# Patient Record
Sex: Female | Born: 1990 | Hispanic: Yes | Marital: Married | Smoking: Never smoker
Health system: Southern US, Community
[De-identification: ages and names within clinical notes are randomized; demographics above are authoritative.]

## PROBLEM LIST (undated history)

## (undated) DIAGNOSIS — A048 Other specified bacterial intestinal infections: Secondary | ICD-10-CM

---

## 2019-08-19 ENCOUNTER — Other Ambulatory Visit: Payer: Self-pay | Admitting: Gastroenterology

## 2019-08-19 DIAGNOSIS — R1011 Right upper quadrant pain: Secondary | ICD-10-CM

## 2019-09-01 ENCOUNTER — Other Ambulatory Visit: Payer: Self-pay

## 2019-09-01 ENCOUNTER — Encounter (HOSPITAL_COMMUNITY)
Admission: RE | Admit: 2019-09-01 | Discharge: 2019-09-01 | Disposition: A | Discharge status: Home / Self Care | Payer: PRIVATE HEALTH INSURANCE | Source: Ambulatory Visit | Attending: Gastroenterology | Admitting: Gastroenterology

## 2019-09-01 DIAGNOSIS — R1011 Right upper quadrant pain: Secondary | ICD-10-CM | POA: Diagnosis present

## 2019-09-01 MED ORDER — TECHNETIUM TC 99M MEBROFENIN IV KIT
5.1800 | PACK | Freq: Once | INTRAVENOUS | Status: AC | PRN
  Administered 2019-09-01: 5.18 via INTRAVENOUS

## 2019-11-30 ENCOUNTER — Other Ambulatory Visit: Payer: Self-pay | Admitting: Gastroenterology

## 2019-11-30 ENCOUNTER — Other Ambulatory Visit (HOSPITAL_COMMUNITY): Payer: Self-pay | Admitting: Gastroenterology

## 2019-11-30 DIAGNOSIS — R110 Nausea: Secondary | ICD-10-CM

## 2019-12-11 ENCOUNTER — Encounter (HOSPITAL_COMMUNITY): Payer: 59

## 2019-12-24 ENCOUNTER — Ambulatory Visit (HOSPITAL_COMMUNITY): Admission: RE | Admit: 2019-12-24 | Payer: 59 | Source: Ambulatory Visit

## 2020-01-08 ENCOUNTER — Encounter (HOSPITAL_COMMUNITY): Admission: RE | Admit: 2020-01-08 | Payer: 59 | Source: Ambulatory Visit

## 2020-01-08 ENCOUNTER — Encounter (HOSPITAL_COMMUNITY): Payer: Self-pay

## 2020-01-21 ENCOUNTER — Encounter (HOSPITAL_COMMUNITY)
Admission: RE | Admit: 2020-01-21 | Discharge: 2020-01-21 | Disposition: A | Discharge status: Home / Self Care | Payer: 59 | Source: Ambulatory Visit | Attending: Gastroenterology | Admitting: Gastroenterology

## 2020-01-21 ENCOUNTER — Other Ambulatory Visit: Payer: Self-pay

## 2020-01-21 DIAGNOSIS — R110 Nausea: Secondary | ICD-10-CM | POA: Diagnosis present

## 2020-01-21 MED ORDER — TECHNETIUM TC 99M SULFUR COLLOID
2.0000 | Freq: Once | INTRAVENOUS | Status: AC | PRN
  Administered 2020-01-21: 2 via INTRAVENOUS

## 2022-03-09 ENCOUNTER — Ambulatory Visit (INDEPENDENT_AMBULATORY_CARE_PROVIDER_SITE_OTHER): Payer: Self-pay | Admitting: Podiatry

## 2022-03-09 DIAGNOSIS — S99921A Unspecified injury of right foot, initial encounter: Secondary | ICD-10-CM

## 2022-03-09 DIAGNOSIS — Z91199 Patient's noncompliance with other medical treatment and regimen due to unspecified reason: Secondary | ICD-10-CM

## 2022-03-09 NOTE — Progress Notes (Signed)
No show

## 2022-04-09 ENCOUNTER — Emergency Department (HOSPITAL_COMMUNITY): Payer: No Typology Code available for payment source

## 2022-04-09 ENCOUNTER — Encounter (HOSPITAL_COMMUNITY): Payer: Self-pay

## 2022-04-09 ENCOUNTER — Emergency Department (HOSPITAL_COMMUNITY)
Admission: EM | Admit: 2022-04-09 | Discharge: 2022-04-09 | Disposition: A | Discharge status: Home / Self Care | Payer: No Typology Code available for payment source | Attending: Emergency Medicine | Admitting: Emergency Medicine

## 2022-04-09 DIAGNOSIS — S0990XA Unspecified injury of head, initial encounter: Secondary | ICD-10-CM

## 2022-04-09 DIAGNOSIS — W228XXA Striking against or struck by other objects, initial encounter: Secondary | ICD-10-CM | POA: Insufficient documentation

## 2022-04-09 DIAGNOSIS — S060X0A Concussion without loss of consciousness, initial encounter: Secondary | ICD-10-CM | POA: Insufficient documentation

## 2022-04-09 MED ORDER — KETOROLAC TROMETHAMINE 30 MG/ML IJ SOLN
30.0000 mg | Freq: Once | INTRAMUSCULAR | Status: AC
  Administered 2022-04-09: 30 mg via INTRAVENOUS
  Filled 2022-04-09: qty 1

## 2022-04-09 MED ORDER — METOCLOPRAMIDE HCL 5 MG/ML IJ SOLN
10.0000 mg | Freq: Once | INTRAMUSCULAR | Status: AC
  Administered 2022-04-09: 10 mg via INTRAVENOUS
  Filled 2022-04-09: qty 2

## 2022-04-09 NOTE — Discharge Instructions (Addendum)
You were seen in the emergency department for evaluation of headache and unsteadiness after a head injury.  Please contact [facility] if persistent symptoms.  Rest.  Tylenol and ibuprofen for pain.  Return if any worsening or concerning symptoms.

## 2022-04-09 NOTE — ED Provider Notes (Signed)
[facility]EMERGENCY DEPT Provider Note   CSN: [id number] Arrival date & time: 04/09/22  1131     History  Chief Complaint  Patient presents with   Head Injury    Kristen Singleton is a 31 y.o. female.  She is here for evaluation of head injury.  She said she hit her head on the car door last week and then again today.  Since the initial injury she has been troubled by headache, photophobia, nausea and feeling unbalanced when she walks.  Sometimes has some blurry vision.  She does have a history of headaches and has been troubled by them for the last few years.  She is not on any blood thinners.  She has tried Excedrin and ibuprofen without improvement.  The history is provided by the patient.  Head Injury Location:  Generalized Mechanism of injury: direct blow   Pain details:    Quality:  Throbbing   Severity:  Severe   Duration:  4 days   Timing:  Constant   Progression:  Unchanged Chronicity:  New Relieved by:  Nothing Worsened by:  Light Ineffective treatments:  NSAIDs Associated symptoms: blurred vision, disorientation, headache and nausea   Associated symptoms: no difficulty breathing, no double vision, no focal weakness, no loss of consciousness, no neck pain and no vomiting       Home Medications Prior to Admission medications   Not on File      Allergies    Patient has no allergy information on record.    Review of Systems   Review of Systems  Constitutional:  Negative for fever.  HENT:  Negative for sore throat.   Eyes:  Positive for blurred vision and visual disturbance. Negative for double vision.  Respiratory:  Negative for shortness of breath.   Cardiovascular:  Negative for chest pain.  Gastrointestinal:  Positive for nausea. Negative for abdominal pain and vomiting.  Genitourinary:  Negative for dysuria.  Musculoskeletal:  Positive for gait problem. Negative for neck pain.  Skin:  Negative for rash.  Neurological:  Positive for  headaches. Negative for focal weakness, loss of consciousness, syncope and speech difficulty.   Physical Exam Updated Vital Signs BP 136/90 (BP Location: Left Arm)   Pulse 72   Temp 98.3 F (36.8 C) (Oral)   Resp 17   LMP 04/06/2022 (Approximate)   SpO2 100%  Physical Exam Vitals and nursing note reviewed.  Constitutional:      General: She is not in acute distress.    Appearance: Normal appearance. She is well-developed.  HENT:     Head: Normocephalic and atraumatic.  Eyes:     Conjunctiva/sclera: Conjunctivae normal.  Cardiovascular:     Rate and Rhythm: Normal rate and regular rhythm.     Heart sounds: No murmur heard. Pulmonary:     Effort: Pulmonary effort is normal. No respiratory distress.     Breath sounds: Normal breath sounds.  Abdominal:     Palpations: Abdomen is soft.     Tenderness: There is no abdominal tenderness.  Musculoskeletal:        General: No swelling.     Cervical back: Neck supple.  Skin:    General: Skin is warm and dry.     Capillary Refill: Capillary refill takes less than 2 seconds.  Neurological:     General: No focal deficit present.     Mental Status: She is alert and oriented to person, place, and time.     Cranial Nerves: No cranial  nerve deficit.     Sensory: No sensory deficit.     Motor: No weakness.    ED Results / Procedures / Treatments   Labs (all labs ordered are listed, but only abnormal results are displayed) Labs Reviewed - No data to display  EKG None  Radiology CT Head Wo Contrast  Result Date: 04/09/2022 CLINICAL DATA:  Head trauma, altered mental status. EXAM: CT HEAD WITHOUT CONTRAST TECHNIQUE: Contiguous axial images were obtained from the base of the skull through the vertex without intravenous contrast. RADIATION DOSE REDUCTION: This exam was performed according to the departmental dose-optimization program which includes automated exposure control, adjustment of the mA and/or kV according to patient size  and/or use of iterative reconstruction technique. COMPARISON:  None Available. FINDINGS: Brain: Slight hypodensity of the right cerebellar white matter tracts for instance on image 9/2 and 51/4 with linear band of hypodensity involving the midbrain on concurrent images for instance 9/2. No evidence of acute hemorrhage, hydrocephalus, extra-axial collection or mass lesion/mass effect. Vascular: No hyperdense vessel or unexpected calcification. Skull: Normal. Negative for fracture or focal lesion. Sinuses/Orbits: Visualized portions of the paranasal sinuses and ethmoid air cells are predominantly clear. Orbits are grossly unremarkable. Other: Mastoid air cells are predominantly clear. IMPRESSION: Evidence of intracranial hemorrhage. Slight hypodensity of the right cerebellar white matter tracts with linear band of hypodensity involving the midbrain on concurrent images for instance 9/2. Findings are favored to reflect artifact however in the setting of altered mental status would consider further evaluation with MRI brain. Electronically Signed   By: Maudry Mayhew M.D.   On: 04/09/2022 12:31   MR BRAIN WO CONTRAST  Result Date: 04/09/2022 CLINICAL DATA:  Ataxia, head trauma EXAM: MRI HEAD WITHOUT CONTRAST TECHNIQUE: Multiplanar, multiecho pulse sequences of the brain and surrounding structures were obtained without intravenous contrast. COMPARISON:  None Available. FINDINGS: Brain: There is no acute infarction or intracranial hemorrhage. There is no intracranial mass, mass effect, or edema. There is no hydrocephalus or extra-axial fluid collection. Ventricles and sulci are normal in size and configuration. Vascular: Major vessel flow voids at the skull base are preserved. Skull and upper cervical spine: Normal marrow signal is preserved. Sinuses/Orbits: Paranasal sinuses are aerated. Orbits are unremarkable. Other: Sella is unremarkable.  Mastoid air cells are clear. IMPRESSION: Normal MRI of the head.  Electronically Signed   By: Guadlupe Spanish M.D.   On: 04/09/2022 14:37    Procedures Procedures    Medications Ordered in ED Medications  metoCLOPramide (REGLAN) injection 10 mg (has no administration in time range)  ketorolac (TORADOL) 30 MG/ML injection 30 mg (has no administration in time range)    ED Course/ Medical Decision Making/ A&P Clinical Course as of 04/09/22 1900  Mon Apr 09, 2022  1443 MRI did not show any acute findings.  Reviewed with patient and husband.  We will give contact information for [facility].  Return instructions discussed [MB]    Clinical Course User Index [MB] Terrilee Files, MD                           Medical Decision Making Amount and/or Complexity of Data Reviewed Radiology: ordered.  Risk Prescription drug management.  This patient complains of head injury nausea unsteadiness; this involves an extensive number of treatment Options and is a complaint that carries with it a high risk of complications and morbidity. The differential includes concussion, skull fracture, intracranial bleed  I ordered  medication IV Toradol and Reglan and reviewed PMP when indicated. I ordered imaging studies which included CT head and MRI brain and I independently    visualized and interpreted imaging which showed no acute findings Additional history obtained from patient significant other Previous records obtained and reviewed in epic no recent admissions Cardiac monitoring reviewed, sinus rhythm Social determinants considered, no significant barriers Critical Interventions: None  After the interventions stated above, I reevaluated the patient and found patient symptoms to be improved Admission and further testing considered, no indications for admission or further work-up at this time.  Given contact information for concussion clinic.  Return instructions discussed.          Final Clinical Impression(s) / ED Diagnoses Final  diagnoses:  Injury of head, initial encounter  Concussion without loss of consciousness, initial encounter    Rx / DC Orders ED Discharge Orders     None         Terrilee FilesButler, Youa Deloney C, MD 04/09/22 1902

## 2022-04-09 NOTE — ED Triage Notes (Signed)
Pt reports closing her trunk and hitting the back of her head with the trunk on Thursday.   Reports since then has been disoriented, nauseas, and dizziness.   Reports she has a migraine and sharp pain where the trunk hit her occipital/top area of head.   A/ox4 Ambulatory in triage.

## 2022-09-30 ENCOUNTER — Emergency Department (HOSPITAL_COMMUNITY): Payer: No Typology Code available for payment source

## 2022-09-30 ENCOUNTER — Other Ambulatory Visit: Payer: Self-pay

## 2022-09-30 ENCOUNTER — Encounter (HOSPITAL_COMMUNITY): Payer: Self-pay | Admitting: Emergency Medicine

## 2022-09-30 ENCOUNTER — Emergency Department (HOSPITAL_COMMUNITY)
Admission: EM | Admit: 2022-09-30 | Discharge: 2022-10-01 | Discharge status: Against Medical Advice | Payer: No Typology Code available for payment source | Attending: Emergency Medicine | Admitting: Emergency Medicine

## 2022-09-30 ENCOUNTER — Ambulatory Visit (HOSPITAL_COMMUNITY): Admission: EM | Admit: 2022-09-30 | Discharge: 2022-09-30 | Payer: No Typology Code available for payment source

## 2022-09-30 DIAGNOSIS — R142 Eructation: Secondary | ICD-10-CM | POA: Diagnosis not present

## 2022-09-30 DIAGNOSIS — R1084 Generalized abdominal pain: Secondary | ICD-10-CM | POA: Insufficient documentation

## 2022-09-30 DIAGNOSIS — R1110 Vomiting, unspecified: Secondary | ICD-10-CM

## 2022-09-30 DIAGNOSIS — Z5321 Procedure and treatment not carried out due to patient leaving prior to being seen by health care provider: Secondary | ICD-10-CM | POA: Insufficient documentation

## 2022-09-30 LAB — URINALYSIS, ROUTINE W REFLEX MICROSCOPIC
Bilirubin Urine: NEGATIVE
Glucose, UA: NEGATIVE mg/dL
Ketones, ur: NEGATIVE mg/dL
Nitrite: NEGATIVE
Protein, ur: NEGATIVE mg/dL
Specific Gravity, Urine: 1.011 (ref 1.005–1.030)
pH: 5 (ref 5.0–8.0)

## 2022-09-30 LAB — COMPREHENSIVE METABOLIC PANEL
ALT: 13 U/L (ref 0–44)
AST: 17 U/L (ref 15–41)
Albumin: 4.1 g/dL (ref 3.5–5.0)
Alkaline Phosphatase: 67 U/L (ref 38–126)
Anion gap: 8 (ref 5–15)
BUN: 8 mg/dL (ref 6–20)
CO2: 24 mmol/L (ref 22–32)
Calcium: 9.3 mg/dL (ref 8.9–10.3)
Chloride: 106 mmol/L (ref 98–111)
Creatinine, Ser: 0.77 mg/dL (ref 0.44–1.00)
GFR, Estimated: >60 mL/min (ref >60)
Glucose, Bld: 84 mg/dL (ref 70–99)
Potassium: 4.1 mmol/L (ref 3.5–5.1)
Sodium: 138 mmol/L (ref 135–145)
Total Bilirubin: 0.6 mg/dL (ref 0.3–1.2)
Total Protein: 7.4 g/dL (ref 6.5–8.1)

## 2022-09-30 LAB — CBC WITH DIFFERENTIAL/PLATELET
Abs Immature Granulocytes: 0.03 10*3/uL (ref 0.00–0.07)
Basophils Absolute: 0 10*3/uL (ref 0.0–0.1)
Basophils Relative: 0 %
Eosinophils Absolute: 0.1 10*3/uL (ref 0.0–0.5)
Eosinophils Relative: 1 %
HCT: 38.5 % (ref 36.0–46.0)
Hemoglobin: 11.7 g/dL — ABNORMAL LOW (ref 12.0–15.0)
Immature Granulocytes: 0 %
Lymphocytes Relative: 39 %
Lymphs Abs: 3.5 10*3/uL (ref 0.7–4.0)
MCH: 20.7 pg — ABNORMAL LOW (ref 26.0–34.0)
MCHC: 30.4 g/dL (ref 30.0–36.0)
MCV: 68.3 fL — ABNORMAL LOW (ref 80.0–100.0)
Monocytes Absolute: 0.5 10*3/uL (ref 0.1–1.0)
Monocytes Relative: 6 %
Neutro Abs: 4.9 10*3/uL (ref 1.7–7.7)
Neutrophils Relative %: 54 %
Platelets: 270 10*3/uL (ref 150–400)
RBC: 5.64 MIL/uL — ABNORMAL HIGH (ref 3.87–5.11)
RDW: 14.6 % (ref 11.5–15.5)
WBC: 9.1 10*3/uL (ref 4.0–10.5)
nRBC: 0 % (ref 0.0–0.2)

## 2022-09-30 LAB — PREGNANCY, URINE: Preg Test, Ur: NEGATIVE

## 2022-09-30 LAB — LIPASE, BLOOD: Lipase: 31 U/L (ref 11–51)

## 2022-09-30 MED ORDER — METOCLOPRAMIDE HCL 5 MG/ML IJ SOLN
10.0000 mg | Freq: Once | INTRAMUSCULAR | Status: AC
  Administered 2022-09-30: 10 mg via INTRAMUSCULAR

## 2022-09-30 MED ORDER — ONDANSETRON HCL 4 MG/2ML IJ SOLN
INTRAMUSCULAR | Status: AC
  Filled 2022-09-30: qty 4

## 2022-09-30 MED ORDER — METOCLOPRAMIDE HCL 5 MG/ML IJ SOLN
INTRAMUSCULAR | Status: AC
  Filled 2022-09-30: qty 2

## 2022-09-30 MED ORDER — ONDANSETRON HCL 4 MG/2ML IJ SOLN
8.0000 mg | Freq: Once | INTRAMUSCULAR | Status: AC
  Administered 2022-09-30: 8 mg via INTRAMUSCULAR

## 2022-09-30 NOTE — ED Provider Notes (Signed)
[facility]    CSN: [id number] Arrival date & time: 09/30/22  1600      History   Chief Complaint Chief Complaint  Patient presents with   Emesis    HPI Kristen Singleton is a 31 y.o. female.   Vomiting persistentily for 3 days, unable to tolerate food and lqiuids, no pattern or trigger, increased gas, gernralzied throbbing, aching,lbm - 3 days ago,   Dizziness, weakness headache, electrolyte water,   Has had vomiting for the three years, gas-x,   History reviewed. No pertinent past medical history.  There are no problems to display for this patient.   History reviewed. No pertinent surgical history.  OB History   No obstetric history on file.      Home Medications    Prior to Admission medications   Medication Sig Start Date End Date Taking? Authorizing Provider  ondansetron (ZOFRAN-ODT) 4 MG disintegrating tablet Take by mouth. 09/05/22  Yes [provider]  omeprazole (PRILOSEC) 20 MG capsule Omeprazole    [provider]    Family History History reviewed. No pertinent family history.  Social History Social History   Tobacco Use   Smoking status: Never   Smokeless tobacco: Never  Vaping Use   Vaping Use: Never used  Substance Use Topics   Alcohol use: Not Currently   Drug use: Never     Allergies   Patient has no known allergies.   Review of Systems Review of Systems  HENT:  Positive for sore throat. Negative for congestion, dental problem, drooling, ear discharge, ear pain, facial swelling, hearing loss, mouth sores, nosebleeds, postnasal drip, rhinorrhea, sinus pressure, sinus pain, sneezing, tinnitus, trouble swallowing and voice change.   Gastrointestinal:  Positive for vomiting. Negative for abdominal distention, abdominal pain, anal bleeding, blood in stool, constipation, diarrhea, nausea and rectal pain.  Skin: Negative.      Physical Exam Triage Vital Signs ED Triage Vitals  Enc Vitals Group     BP  09/30/22 1620 105/74     Pulse Rate 09/30/22 1620 99     Resp 09/30/22 1620 20     Temp 09/30/22 1620 98.4 F (36.9 C)     Temp Source 09/30/22 1620 Oral     SpO2 09/30/22 1620 100 %     Weight --      Height --      Head Circumference --      Peak Flow --      Pain Score 09/30/22 1617 10     Pain Loc --      Pain Edu? --      Excl. in GC? --    No data found.  Updated Vital Signs BP 105/74 Comment: large cuff  Pulse 99   Temp 98.4 F (36.9 C) (Oral)   Resp 20   LMP 09/08/2022   SpO2 100%   Visual Acuity Right Eye Distance:   Left Eye Distance:   Bilateral Distance:    Right Eye Near:   Left Eye Near:    Bilateral Near:     Physical Exam   UC Treatments / Results  Labs (all labs ordered are listed, but only abnormal results are displayed) Labs Reviewed - No data to display  EKG   Radiology No results found.  Procedures Procedures (including critical care time)  Medications Ordered in UC Medications - No data to display  Initial Impression / Assessment and Plan / UC Course  I have reviewed the triage vital signs and  the nursing notes.  Pertinent labs & imaging results that were available during my care of the patient were reviewed by me and considered in my medical decision making (see chart for details).     *** Final Clinical Impressions(s) / UC Diagnoses   Final diagnoses:  None   Discharge Instructions   None    ED Prescriptions   None    PDMP not reviewed this encounter.

## 2022-09-30 NOTE — ED Provider Triage Note (Signed)
Emergency Medicine Provider Triage Evaluation Note  Danuta Huseman , a 31 y.o. female  was evaluated in triage.  Pt complains of vomiting. Unable to hold down food or fluids x 3 days.  Hx of h.pylori treated and resolved. C/o belching. Vomiting. No bm's.  Review of Systems  Positive: vomiting Negative: fever  Physical Exam  BP 107/70 (BP Location: Left Arm)   Pulse 86   Temp 98.4 F (36.9 C) (Oral)   Resp 19   LMP 09/08/2022   SpO2 99%  Gen:   Awake, no distress   Resp:  Normal effort  MSK:   Moves extremities without difficulty  Other:  Actively blching  Medical Decision Making  Medically screening exam initiated at 7:02 PM.  Appropriate orders placed.  Indie Boehne was informed that the remainder of the evaluation will be completed by another provider, this initial triage assessment does not replace that evaluation, and the importance of remaining in the ED until their evaluation is complete.     Arthor Captain, PA-C 09/30/22 1907

## 2022-09-30 NOTE — ED Notes (Signed)
Patient is being discharged from the Urgent Care and sent to the Emergency Department via personal vehicle. Per Salli Quarry NP, patient is in need of higher level of care due to emesis. Patient is aware and verbalizes understanding of plan of care.  Vitals:   09/30/22 1620  BP: 105/74  Pulse: 99  Resp: 20  Temp: 98.4 F (36.9 C)  SpO2: 100%

## 2022-09-30 NOTE — ED Notes (Signed)
Patient was discharged from urgent care.  Patient was removed from chart rack by ED

## 2022-09-30 NOTE — ED Triage Notes (Signed)
Patient reports belching frequently, then vomits.  Denies nausea until she is in the middle of vomiting.  Symptoms started Friday.  Reports abdominal pain is generalized pain.  Reports this issue is ongoing for the past 2 years.  History of hpylori-most recent test was negative.  Pcp has given patient a referral to GI.    Patient reports this is the worst of episodes.  Unable to hold down liquids

## 2022-09-30 NOTE — Discharge Instructions (Signed)
Go to the nearest emergency department for management of your vomiting, in office today we have attempted to 8 mg injection of Zofran as well as 10 mg injection of Reglan which are treatments for vomiting at this point I believe that you will need higher level of care

## 2022-09-30 NOTE — ED Triage Notes (Signed)
Patient reports generalized abdominal pain with persistent emesis since Friday , no diarrhea or fever .

## 2022-09-30 NOTE — ED Notes (Signed)
X3 no response 

## 2023-09-27 IMAGING — MR MR HEAD W/O CM
10 series · 48 of 48 positions shown · non-contrast
Comparison: None Available.

CLINICAL DATA: Ataxia, head trauma

EXAM:
MRI HEAD WITHOUT CONTRAST
TECHNIQUE: Multiplanar, multiecho pulse sequences of the brain and surrounding
structures were obtained without intravenous contrast.

[Series 5: DWI · axial · 3.0mm · 1.36mm/px · z∈[-71,+75]mm · 8 of 100 slices shown (1 of 2)]
[im 1/100]
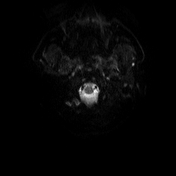
[im 15/100]
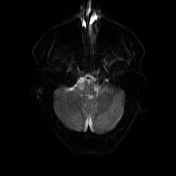
[im 29/100]
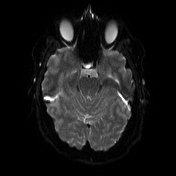
[im 43/100]
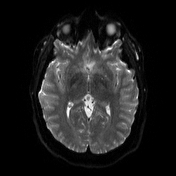
[im 57/100]
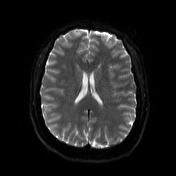
[im 71/100]
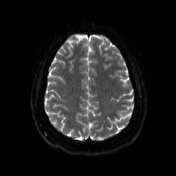
[im 85/100]
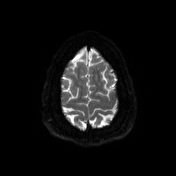
[im 100/100]
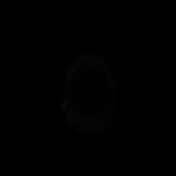

[Series 6: DWI · axial · 3.0mm · 1.36mm/px · z∈[-71,+75]mm · 4 of 50 slices shown (2 of 2)]
[im 1/50]
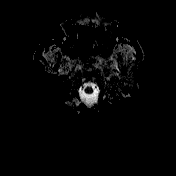
[im 17/50]
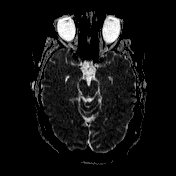
[im 33/50]
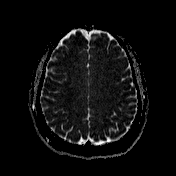
[im 50/50]
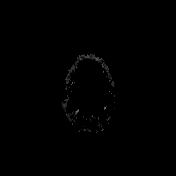

[Series 7: T1 · sagittal · 5.0mm · 0.75mm/px · 2 of 24 slices shown (1 of 2)]
[im 1/24]
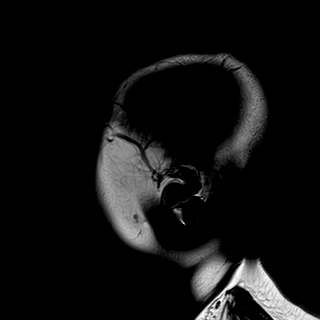
[im 24/24]
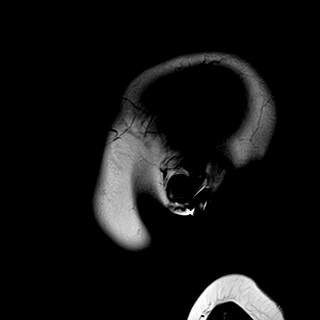

[Series 8: T2 · axial · 5.0mm · 0.62mm/px · z∈[-77,+84]mm · 2 of 26 slices shown (1 of 2)]
[im 1/26]
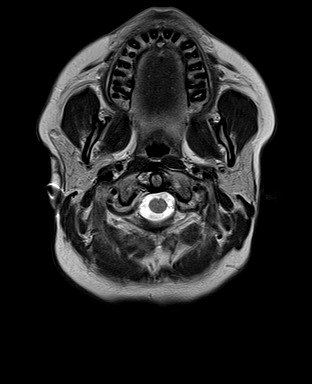
[im 26/26]
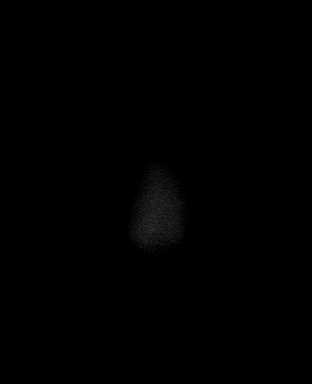

[Series 9: swi_images · axial · 3.0mm · 0.75mm/px · z∈[-102,+109]mm · 6 of 72 slices shown]
[im 1/72]
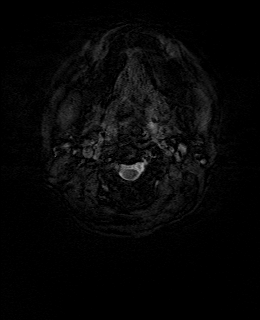
[im 15/72]
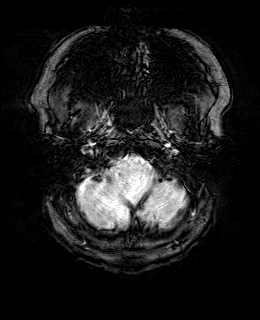
[im 29/72]
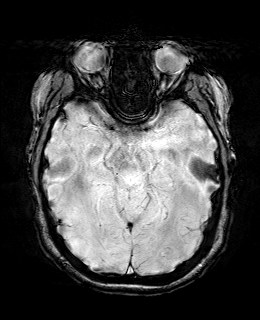
[im 43/72]
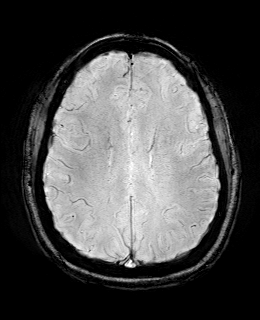
[im 57/72]
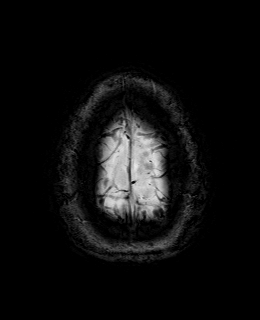
[im 72/72]
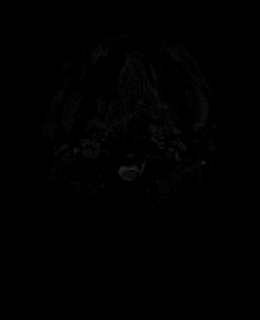

[Series 11: FLAIR · axial · 3.0mm · 0.75mm/px · z∈[-73,+79]mm · 4 of 52 slices shown]
[im 1/52]
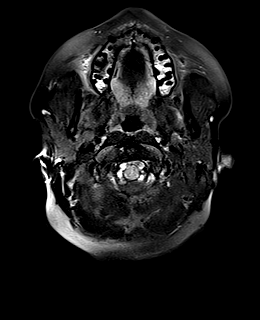
[im 18/52]
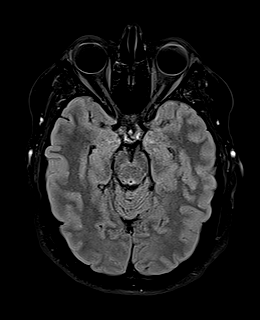
[im 35/52]
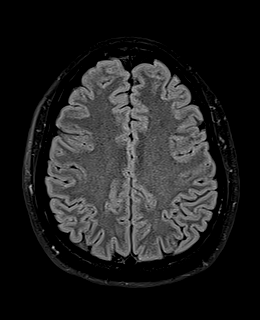
[im 52/52]
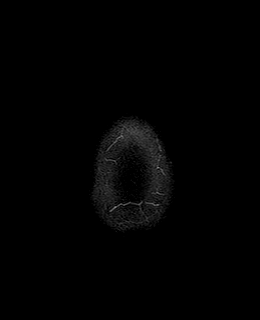

[Series 12: T1 · axial · 1.0mm · 0.94mm/px · z∈[-68,+74]mm · 12 of 144 slices shown (2 of 2)]
[im 1/144]
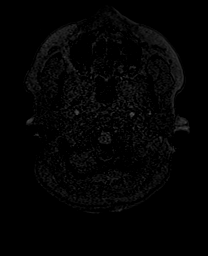
[im 14/144]
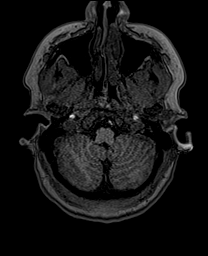
[im 27/144]
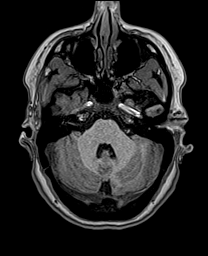
[im 40/144]
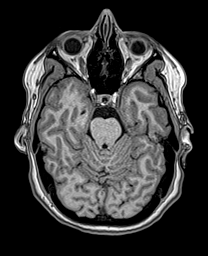
[im 53/144]
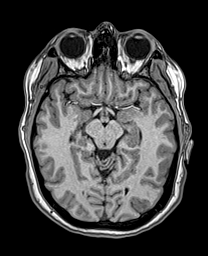
[im 66/144]
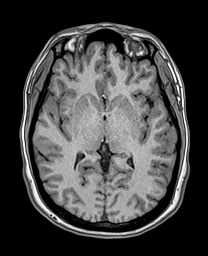
[im 79/144]
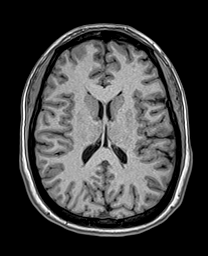
[im 92/144]
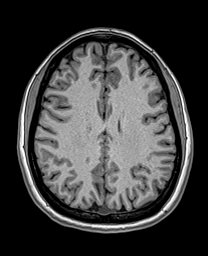
[im 105/144]
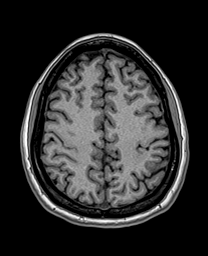
[im 118/144]
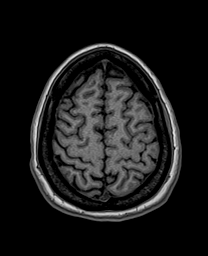
[im 131/144]
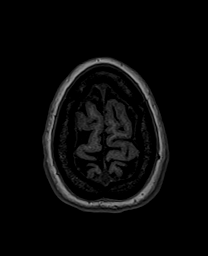
[im 144/144]
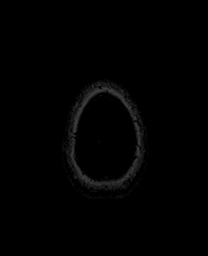

[Series 13: cor dwi_tracew · coronal · 5.0mm · 1.53mm/px · 5 of 60 slices shown]
[im 1/60]
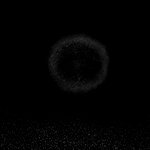
[im 15/60]
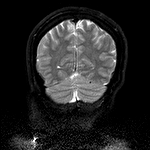
[im 30/60]
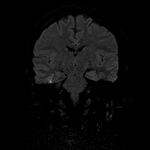
[im 45/60]
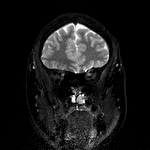
[im 60/60]
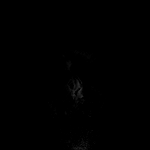

[Series 14: cor dwi_adc · coronal · 5.0mm · 1.53mm/px · 2 of 29 slices shown]
[im 1/29]
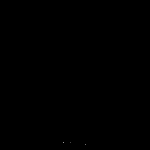
[im 29/29]
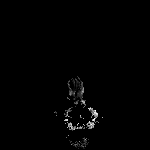

[Series 15: T2 · coronal · 5.0mm · 0.57mm/px · 3 of 38 slices shown (2 of 2)]
[im 1/38]
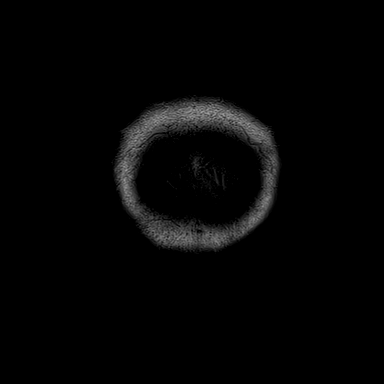
[im 19/38]
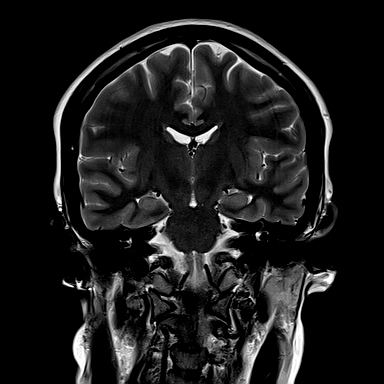
[im 38/38]
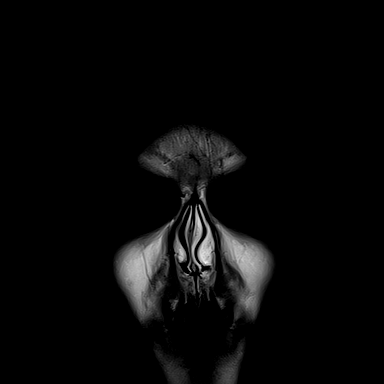

[48 of 48 positions shown; findings below may reference images not displayed]

FINDINGS: Brain: There is no acute infarction or intracranial hemorrhage.
There is no intracranial mass, mass effect, or edema. There is no
hydrocephalus or extra-axial fluid collection. Ventricles and sulci
are normal in size and configuration.

Vascular: Major vessel flow voids at the skull base are preserved.

Skull and upper cervical spine: Normal marrow signal is preserved.

Sinuses/Orbits: Paranasal sinuses are aerated. Orbits are
unremarkable.

Other: Sella is unremarkable.  Mastoid air cells are clear.
IMPRESSION: Normal MRI of the head.

## 2024-03-30 ENCOUNTER — Encounter (HOSPITAL_COMMUNITY): Payer: Self-pay | Admitting: Emergency Medicine

## 2024-03-30 ENCOUNTER — Ambulatory Visit (HOSPITAL_COMMUNITY): Admission: EM | Admit: 2024-03-30 | Discharge: 2024-03-30 | Disposition: A | Discharge status: Home / Self Care

## 2024-03-30 DIAGNOSIS — K529 Noninfective gastroenteritis and colitis, unspecified: Secondary | ICD-10-CM | POA: Insufficient documentation

## 2024-03-30 HISTORY — DX: Other specified bacterial intestinal infections: A04.8

## 2024-03-30 LAB — C DIFFICILE QUICK SCREEN W PCR REFLEX
C Diff antigen: NEGATIVE
C Diff interpretation: NOT DETECTED
C Diff toxin: NEGATIVE

## 2024-03-30 MED ORDER — ONDANSETRON 4 MG PO TBDP: 4.0000 mg | ORAL_TABLET | Freq: Three times a day (TID) | ORAL | 0 refills | Status: AC | PRN

## 2024-03-30 MED ORDER — ONDANSETRON 4 MG PO TBDP
4.0000 mg | ORAL_TABLET | Freq: Once | ORAL | Status: AC
  Administered 2024-03-30: 4 mg via ORAL

## 2024-03-30 MED ORDER — ONDANSETRON 4 MG PO TBDP
ORAL_TABLET | ORAL | Status: AC
  Filled 2024-03-30: qty 1

## 2024-03-30 MED ORDER — DICYCLOMINE HCL 20 MG PO TABS: 20.0000 mg | ORAL_TABLET | Freq: Four times a day (QID) | ORAL | 0 refills | Status: AC

## 2024-03-30 NOTE — Discharge Instructions (Addendum)
  1. Gastroenteritis (Primary) - ondansetron  (ZOFRAN -ODT) disintegrating tablet 4 mg given in UC for acute nausea and vomiting symptoms - C Difficile Quick Screen w PCR reflex ordered for home collection due to diarrhea - Gastrointestinal Panel by PCR , Stool ordered for home collection due to diarrhea - ondansetron  (ZOFRAN -ODT) 4 MG disintegrating tablet; Take 1 tablet (4 mg total) by mouth every 8 (eight) hours as needed for nausea or vomiting.  Dispense: 20 tablet; Refill: 0 - dicyclomine (BENTYL) 20 MG tablet; Take 1 tablet (20 mg total) by mouth every 6 (six) hours.  Dispense: 30 tablet; Refill: 0 -Continue to monitor symptoms for any change in severity if there is any escalation of current symptoms or development of new symptoms follow-up in ER for further evaluation and management.

## 2024-03-30 NOTE — ED Notes (Signed)
 Pt went to restroom but wasn't able to get any urine for testing that is ordered.

## 2024-03-30 NOTE — ED Provider Notes (Signed)
 [facility]  Note:  This document was prepared using Dragon voice recognition software and may include unintentional dictation errors.  MRN: [id number] DOB: [birth year]  Subjective:   Kristen Singleton is a 33 y.o. female presenting for diarrhea, generalized abdominal pain, nausea and increased flatulence x 2 days.  Patient has been using Gas-X and Pepto-Bismol with minimal improvement to symptoms.  Patient denies any known sick contacts.  Patient has not taken any antidiarrheal medication or antinausea medication.  No fever, shortness of breath, chest pain, weakness, dizziness, dysuria, increased urinary frequency.  No current facility-administered medications for this encounter.  Current Outpatient Medications:    dicyclomine (BENTYL) 20 MG tablet, Take 1 tablet (20 mg total) by mouth every 6 (six) hours., Disp: 30 tablet, Rfl: 0   ondansetron  (ZOFRAN -ODT) 4 MG disintegrating tablet, Take 1 tablet (4 mg total) by mouth every 8 (eight) hours as needed for nausea or vomiting., Disp: 20 tablet, Rfl: 0   No Known Allergies  Past Medical History:  Diagnosis Date   H. pylori infection      History reviewed. No pertinent surgical history.  No family history on file.  Social History   Tobacco Use   Smoking status: Never   Smokeless tobacco: Never  Vaping Use   Vaping status: Never Used  Substance Use Topics   Alcohol use: Not Currently   Drug use: Never    ROS Refer to HPI for ROS details.  Objective:   Vitals: BP 100/68 (BP Location: Left Arm)   Pulse (!) 116   Temp 98.3 F (36.8 C) (Oral)   Resp 18   LMP 03/19/2024   SpO2 97%   Physical Exam Vitals and nursing note reviewed.  Constitutional:      General: She is not in acute distress.    Appearance: She is well-developed. She is not ill-appearing or toxic-appearing.  HENT:     Head: Normocephalic and atraumatic.     Nose: Nose normal.     Mouth/Throat:     Mouth: Mucous membranes are moist.   Cardiovascular:     Rate and Rhythm: Normal rate.  Pulmonary:     Effort: Pulmonary effort is normal. No respiratory distress.  Abdominal:     General: There is no distension.     Palpations: Abdomen is soft.     Tenderness: There is abdominal tenderness (Generalized abdominal tenderness with palpation, no point tenderness, no guarding.). There is left CVA tenderness (Mild left CVA tenderness with palpation, will check urinalysis to ensure no bladder infection is present.). There is no right CVA tenderness, guarding or rebound.  Musculoskeletal:        General: Normal range of motion.  Skin:    General: Skin is warm and dry.  Neurological:     General: No focal deficit present.     Mental Status: She is alert and oriented to person, place, and time.  Psychiatric:        Mood and Affect: Mood normal.        Behavior: Behavior normal.     Procedures  No results found for this or any previous visit (from the past 24 hours).  No results found.   Assessment and Plan :     Discharge Instructions       1. Gastroenteritis (Primary) - ondansetron  (ZOFRAN -ODT) disintegrating tablet 4 mg given in UC for acute nausea and vomiting symptoms - C Difficile Quick Screen w PCR reflex ordered for home collection due to diarrhea - Gastrointestinal Panel  by PCR , Stool ordered for home collection due to diarrhea - ondansetron  (ZOFRAN -ODT) 4 MG disintegrating tablet; Take 1 tablet (4 mg total) by mouth every 8 (eight) hours as needed for nausea or vomiting.  Dispense: 20 tablet; Refill: 0 - dicyclomine (BENTYL) 20 MG tablet; Take 1 tablet (20 mg total) by mouth every 6 (six) hours.  Dispense: 30 tablet; Refill: 0 -Continue to monitor symptoms for any change in severity if there is any escalation of current symptoms or development of new symptoms follow-up in ER for further evaluation and management.    Kita Neace B [place]   Sun Kihn, [place], [place] 03/30/24 1136

## 2024-03-30 NOTE — ED Triage Notes (Addendum)
 Pt reports had diarrhea, generalized abd pains, nausea and belching for 2 days. Taken Pepto Bismol and Gas X.

## 2024-03-30 NOTE — ED Notes (Addendum)
 Pt went to restroom and attempt to get a urine specimen but wasn't able to at this time. Jessee Mormon, NP made aware.

## 2024-03-31 LAB — GASTROINTESTINAL PANEL BY PCR, STOOL (REPLACES STOOL CULTURE)
